# Patient Record
Sex: Male | Born: 1990 | Race: Black or African American | Hispanic: No | Marital: Single | Smoking: Current every day smoker
Health system: Southern US, Community
[De-identification: ages and names within clinical notes are randomized; demographics above are authoritative.]

## PROBLEM LIST (undated history)

## (undated) ENCOUNTER — Emergency Department (HOSPITAL_BASED_OUTPATIENT_CLINIC_OR_DEPARTMENT_OTHER): Admission: EM | Source: Home / Self Care

## (undated) DIAGNOSIS — I10 Essential (primary) hypertension: Secondary | ICD-10-CM

## (undated) DIAGNOSIS — E119 Type 2 diabetes mellitus without complications: Secondary | ICD-10-CM

## (undated) HISTORY — DX: Type 2 diabetes mellitus without complications: E11.9

## (undated) HISTORY — DX: Essential (primary) hypertension: I10

---
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
## 2010-08-08 ENCOUNTER — Emergency Department (INDEPENDENT_AMBULATORY_CARE_PROVIDER_SITE_OTHER)

## 2010-08-08 ENCOUNTER — Emergency Department (HOSPITAL_BASED_OUTPATIENT_CLINIC_OR_DEPARTMENT_OTHER)
Admission: EM | Admit: 2010-08-08 | Discharge: 2010-08-09 | Disposition: A | Discharge status: Home / Self Care | Attending: Emergency Medicine | Admitting: Emergency Medicine

## 2010-08-08 DIAGNOSIS — F17200 Nicotine dependence, unspecified, uncomplicated: Secondary | ICD-10-CM | POA: Insufficient documentation

## 2010-08-08 DIAGNOSIS — M545 Low back pain: Secondary | ICD-10-CM

## 2013-11-29 ENCOUNTER — Emergency Department (HOSPITAL_BASED_OUTPATIENT_CLINIC_OR_DEPARTMENT_OTHER)

## 2013-11-29 ENCOUNTER — Encounter (HOSPITAL_BASED_OUTPATIENT_CLINIC_OR_DEPARTMENT_OTHER): Payer: Self-pay | Admitting: Emergency Medicine

## 2013-11-29 ENCOUNTER — Emergency Department (HOSPITAL_BASED_OUTPATIENT_CLINIC_OR_DEPARTMENT_OTHER)
Admission: EM | Admit: 2013-11-29 | Discharge: 2013-11-29 | Disposition: A | Discharge status: Home / Self Care | Attending: Emergency Medicine | Admitting: Emergency Medicine

## 2013-11-29 DIAGNOSIS — X500XXA Overexertion from strenuous movement or load, initial encounter: Secondary | ICD-10-CM | POA: Insufficient documentation

## 2013-11-29 DIAGNOSIS — S93409A Sprain of unspecified ligament of unspecified ankle, initial encounter: Secondary | ICD-10-CM | POA: Insufficient documentation

## 2013-11-29 DIAGNOSIS — Y9367 Activity, basketball: Secondary | ICD-10-CM | POA: Insufficient documentation

## 2013-11-29 DIAGNOSIS — Y92838 Other recreation area as the place of occurrence of the external cause: Secondary | ICD-10-CM

## 2013-11-29 DIAGNOSIS — F17200 Nicotine dependence, unspecified, uncomplicated: Secondary | ICD-10-CM | POA: Insufficient documentation

## 2013-11-29 DIAGNOSIS — S93401A Sprain of unspecified ligament of right ankle, initial encounter: Secondary | ICD-10-CM

## 2013-11-29 DIAGNOSIS — Z8781 Personal history of (healed) traumatic fracture: Secondary | ICD-10-CM | POA: Insufficient documentation

## 2013-11-29 DIAGNOSIS — Y9239 Other specified sports and athletic area as the place of occurrence of the external cause: Secondary | ICD-10-CM | POA: Insufficient documentation

## 2013-11-29 MED ORDER — IBUPROFEN 800 MG PO TABS: 800.0000 mg | ORAL_TABLET | Freq: Three times a day (TID) | ORAL | Status: DC | PRN

## 2013-11-29 NOTE — Discharge Instructions (Signed)
Take ibuprofen as directed as needed for pain and swelling.  Ankle Sprain An ankle sprain is an injury to the strong, fibrous tissues (ligaments) that hold the bones of your ankle joint together.  CAUSES An ankle sprain is usually caused by a fall or by twisting your ankle. Ankle sprains most commonly occur when you step on the outer edge of your foot, and your ankle turns inward. People who participate in sports are more prone to these types of injuries.  SYMPTOMS   Pain in your ankle. The pain may be present at rest or only when you are trying to stand or walk.  Swelling.  Bruising. Bruising may develop immediately or within 1 to 2 days after your injury.  Difficulty standing or walking, particularly when turning corners or changing directions. DIAGNOSIS  Your caregiver will ask you details about your injury and perform a physical exam of your ankle to determine if you have an ankle sprain. During the physical exam, your caregiver will press on and apply pressure to specific areas of your foot and ankle. Your caregiver will try to move your ankle in certain ways. An X-ray exam may be done to be sure a bone was not broken or a ligament did not separate from one of the bones in your ankle (avulsion fracture).  TREATMENT  Certain types of braces can help stabilize your ankle. Your caregiver can make a recommendation for this. Your caregiver may recommend the use of medicine for pain. If your sprain is severe, your caregiver may refer you to a surgeon who helps to restore function to parts of your skeletal system (orthopedist) or a physical therapist. HOME CARE INSTRUCTIONS   Apply ice to your injury for 1 2 days or as directed by your caregiver. Applying ice helps to reduce inflammation and pain.  Put ice in a plastic bag.  Place a towel between your skin and the bag.  Leave the ice on for 15-20 minutes at a time, every 2 hours while you are awake.  Only take over-the-counter or  prescription medicines for pain, discomfort, or fever as directed by your caregiver.  Elevate your injured ankle above the level of your heart as much as possible for 2 3 days.  If your caregiver recommends crutches, use them as instructed. Gradually put weight on the affected ankle. Continue to use crutches or a cane until you can walk without feeling pain in your ankle.  If you have a plaster splint, wear the splint as directed by your caregiver. Do not rest it on anything harder than a pillow for the first 24 hours. Do not put weight on it. Do not get it wet. You may take it off to take a shower or bath.  You may have been given an elastic bandage to wear around your ankle to provide support. If the elastic bandage is too tight (you have numbness or tingling in your foot or your foot becomes cold and blue), adjust the bandage to make it comfortable.  If you have an air splint, you may blow more air into it or let air out to make it more comfortable. You may take your splint off at night and before taking a shower or bath. Wiggle your toes in the splint several times per day to decrease swelling. SEEK MEDICAL CARE IF:   You have rapidly increasing bruising or swelling.  Your toes feel extremely cold or you lose feeling in your foot.  Your pain is not relieved with  medicine. SEEK IMMEDIATE MEDICAL CARE IF:  Your toes are numb or blue.  You have severe pain that is increasing. MAKE SURE YOU:   Understand these instructions.  Will watch your condition.  Will get help right away if you are not doing well or get worse. Document Released: 06/24/2005 Document Revised: 03/18/2012 Document Reviewed: 07/06/2011 [facility] Patient Information 2014 [place], [place].

## 2013-11-29 NOTE — ED Notes (Signed)
Pt c/o rt ankle pain after playing basketball, hx of fx to rt ankle

## 2013-11-29 NOTE — ED Provider Notes (Signed)
CSN: [id number]     Arrival date & time 11/29/13  2148 History   First MD Initiated Contact with Patient 11/29/13 2153     Chief Complaint  Patient presents with  . Ankle Pain     (Consider location/radiation/quality/duration/timing/severity/associated sxs/prior Treatment) HPI Comments: 23 year old male presents to the emergency department complaining of right ankle pain after landing wrong while playing basketball earlier this evening. Patient states he was jumping up when he landed on his right ankle causing it to twist. States it swelled immediately. He placed an ankle brace that he had from a prior ankle fracture on it with some relief. He has not taken any medication for his pain. Pain currently rated 8/10, described as sharp and throbbing. States his ankle is tingling. Denies numbness or tingling in his toes.  Patient is a 23 y.o. male presenting with ankle pain. The history is provided by the patient.  Ankle Pain   History reviewed. No pertinent past medical history. History reviewed. No pertinent past surgical history. No family history on file. History  Substance Use Topics  . Smoking status: Current Every Day Smoker  . Smokeless tobacco: Not on file  . Alcohol Use: Yes     Comment: social    Review of Systems  Constitutional: Negative.   HENT: Negative.   Musculoskeletal:       Positive for right ankle pain and swelling.  Skin: Negative for color change.  Neurological: Negative for numbness.      Allergies  Review of patient's allergies indicates no known allergies.  Home Medications   Prior to Admission medications   Not on File   BP 138/81  Pulse 67  Temp(Src) 99.1 F (37.3 C) (Oral)  Resp 16  Ht 5\' 5"  (1.651 m)  Wt 250 lb (113.399 kg)  BMI 41.60 kg/m2  SpO2 98% Physical Exam  Nursing note and vitals reviewed. Constitutional: He is oriented to person, place, and time. He appears well-developed and well-nourished. No distress.  HENT:  Head:  Normocephalic and atraumatic.  Mouth/Throat: Oropharynx is clear and moist.  Eyes: Conjunctivae are normal.  Neck: Normal range of motion. Neck supple.  Cardiovascular: Normal rate, regular rhythm and normal heart sounds.   +2 PT/DP pulses on right.  Pulmonary/Chest: Effort normal and breath sounds normal.  Musculoskeletal:  Tender to palpation both medial and lateral aspect of right ankle, swelling noted laterally. Range of motion limited by pain, more so with ankle flexion. No deformity. No tenderness of proximal fibula. Wiggles his toes without any difficulty.  Neurological: He is alert and oriented to person, place, and time.  Sensation intact.  Skin: Skin is warm and dry. He is not diaphoretic.  Psychiatric: He has a normal mood and affect. His behavior is normal.    ED Course  Procedures (including critical care time) Labs Review Labs Reviewed - No data to display  Imaging Review Dg Ankle Complete Right  11/29/2013   CLINICAL DATA:  Rolled RIGHT ankle today, lateral pain and swelling  EXAM: RIGHT ANKLE - COMPLETE 3+ VIEW  COMPARISON:  10/30/2004  FINDINGS: Soft tissue swelling greatest laterally.  Ankle mortise intact.  Osseous mineralization normal.  Tiny ossicle adjacent to tip of medial malleolus, appears corticated/old.  No acute fracture, dislocation, or bone destruction.  IMPRESSION: No acute osseous abnormalities.   Electronically Signed   By: Ulyses Southward M.D.   On: 11/29/2013 22:39     EKG Interpretation None      MDM   Final diagnoses:  Right ankle sprain    Neurovascularly intact. Xray without any acute finding. Ace wrap applied. Pt would not like crutches. Discussed RICE, NSAIDs. Stable for d/c. Return precautions given. Patient states understanding of treatment care plan and is agreeable.   Trevor MaceRobyn M Albert, PA-C 11/29/13 2254

## 2013-12-03 NOTE — ED Provider Notes (Signed)
Medical screening examination/treatment/procedure(s) were performed by non-physician practitioner and as supervising physician I was immediately available for consultation/collaboration.   EKG Interpretation None        Vara Mairena T Jovani Colquhoun, MD 12/03/13 1559 

## 2017-03-22 ENCOUNTER — Emergency Department (HOSPITAL_COMMUNITY)

## 2017-03-22 ENCOUNTER — Emergency Department (HOSPITAL_COMMUNITY)
Admission: EM | Admit: 2017-03-22 | Discharge: 2017-03-22 | Disposition: A | Discharge status: Home / Self Care | Attending: Emergency Medicine | Admitting: Emergency Medicine

## 2017-03-22 ENCOUNTER — Encounter (HOSPITAL_COMMUNITY): Payer: Self-pay | Admitting: Emergency Medicine

## 2017-03-22 DIAGNOSIS — M25552 Pain in left hip: Secondary | ICD-10-CM

## 2017-03-22 DIAGNOSIS — F17200 Nicotine dependence, unspecified, uncomplicated: Secondary | ICD-10-CM | POA: Insufficient documentation

## 2017-03-22 MED ORDER — KETOROLAC TROMETHAMINE 30 MG/ML IJ SOLN
15.0000 mg | Freq: Once | INTRAMUSCULAR | Status: AC
  Administered 2017-03-22: 15 mg via INTRAMUSCULAR
  Filled 2017-03-22: qty 1

## 2017-03-22 MED ORDER — IBUPROFEN 600 MG PO TABS: 600.0000 mg | ORAL_TABLET | Freq: Four times a day (QID) | ORAL | 0 refills | Status: DC | PRN

## 2017-03-22 MED ORDER — CYCLOBENZAPRINE HCL 10 MG PO TABS: 10.0000 mg | ORAL_TABLET | Freq: Two times a day (BID) | ORAL | 0 refills | Status: DC | PRN

## 2017-03-22 MED ORDER — ACETAMINOPHEN 500 MG PO TABS: 500.0000 mg | ORAL_TABLET | Freq: Four times a day (QID) | ORAL | 0 refills | Status: DC | PRN

## 2017-03-22 NOTE — Discharge Instructions (Signed)
Medications: Flexeril, ibuprofen, Tylenol  Treatment: Take Flexeril twice daily for muscle pain and spasms. Do not drive or operate machinery when taking this medication. It may be best only take this medication at home as it can make you sleepy. Take ibuprofen every 6 hours as needed for your pain. You can also alternate with Tylenol in between. Use a heating pad and ice alternating 20 minutes on, 20 minutes off. Attempt the exercises and stretches as outlined below.  Follow-up: Please follow-up with the orthopedic doctor for further evaluation and treatment. Please return to emergency department if you develop any new or worsening symptoms.

## 2017-03-22 NOTE — ED Notes (Signed)
Informed by Florentina Addison, RN that pt was able to stand and pivot from the wheelchair to chair in room.

## 2017-03-22 NOTE — ED Provider Notes (Signed)
[facility]-EMERGENCY DEPT Provider Note   CSN: [id number] Arrival date & time: 03/22/17  1139     History   Chief Complaint Chief Complaint  Patient presents with  . Hip Pain    HPI Dylan Morris is a 26 y.o. male who was previously healthy who presents with a 1 month history of left hip pain. Patient reports his pain initially began as intermittent, however it has been constant for the past few days. He reports that it starts out with a cramp becomes severe. It radiates from the back of his left hip/low back down to his ankle. Patient reports he occasionally feels pain in his left groin as well. It is worse with movement, better with rest. 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 He reports mild tingling at times, but nothing severe. He denies any saddle anesthesia, loss of bowel or bladder control. He denies any fevers, history of IVDU, abdominal pain, nausea, vomiting, or other urinary symptoms. He has taken an unknown muscle relaxer (a total of 4 pills) without relief. He has not tried anything else home.  HPI  History reviewed. No pertinent past medical history.  There are no active problems to display for this patient.   History reviewed. No pertinent surgical history.     Home Medications    Prior to Admission medications   Medication Sig Start Date End Date Taking? Authorizing Provider  acetaminophen (TYLENOL) 500 MG tablet Take 1 tablet (500 mg total) by mouth every 6 (six) hours as needed. 03/22/17   Zania Kalisz, Waylan Boga, PA-C  cyclobenzaprine (FLEXERIL) 10 MG tablet Take 1 tablet (10 mg total) by mouth 2 (two) times daily as needed for muscle spasms. 03/22/17   Jerine Surles, Waylan Boga, PA-C  ibuprofen (ADVIL,MOTRIN) 600 MG tablet Take 1 tablet (600 mg total) by mouth every 6 (six) hours as needed. 03/22/17   Emi Holes, PA-C    Family History No family history on file.  Social History Social History  Substance Use Topics  .  Smoking status: Current Every Day Smoker  . Smokeless tobacco: Not on file  . Alcohol use Yes     Comment: social     Allergies   Patient has no known allergies.   Review of Systems Review of Systems  Constitutional: Negative for chills and fever.  HENT: Negative for facial swelling and sore throat.   Respiratory: Negative for shortness of breath.   Cardiovascular: Negative for chest pain.  Gastrointestinal: Negative for abdominal pain, nausea and vomiting.  Genitourinary: Negative for dysuria.  Musculoskeletal: Positive for arthralgias and myalgias. Negative for back pain.  Skin: Negative for rash and wound.  Neurological: Negative for headaches.  Psychiatric/Behavioral: The patient is not nervous/anxious.      Physical Exam Updated Vital Signs BP (!) 112/101 (BP Location: Left Arm)   Pulse 75   Temp 99 F (37.2 C) (Oral)   Resp 16   Ht  (1.676 m)   Wt 113.4 kg (250 lb)   SpO2 100%   BMI 40.35 kg/m   Physical Exam  Constitutional: He appears well-developed and well-nourished. No distress.  HENT:  Head: Normocephalic and atraumatic.  Mouth/Throat: Oropharynx is clear and moist. No oropharyngeal exudate.  Eyes: Pupils are equal, round, and reactive to light. Conjunctivae are normal. Right eye exhibits no discharge. Left eye exhibits no discharge. No scleral icterus.  Neck: Normal range of motion. Neck supple. No thyromegaly present.  Cardiovascular: Normal rate, regular rhythm, normal heart sounds and intact distal pulses.  Exam reveals no gallop and no friction rub.   No murmur heard. Pulmonary/Chest: Effort normal and breath sounds normal. No stridor. No respiratory distress. He has no wheezes. He has no rales.  Abdominal: Soft. Bowel sounds are normal. He exhibits no distension. There is no tenderness. There is no rebound and no guarding.  Musculoskeletal: He exhibits no edema.       Legs: Lymphadenopathy:    He has no cervical adenopathy.  Neurological:  He is alert. Coordination normal.  Normal sensation, 5/5 strength to bilateral lower extremities  Skin: Skin is warm and dry. No rash noted. He is not diaphoretic. No pallor.  Psychiatric: He has a normal mood and affect.  Nursing note and vitals reviewed.    ED Treatments / Results  Labs (all labs ordered are listed, but only abnormal results are displayed) Labs Reviewed - No data to display  EKG  EKG Interpretation None       Radiology Dg Hip Unilat W Or Wo Pelvis 2-3 Views Left  Result Date: 03/22/2017 CLINICAL DATA:  Pain for 1 month without trauma. EXAM: DG HIP (WITH OR WITHOUT PELVIS) 2-3V LEFT COMPARISON:  None. FINDINGS: There is no evidence of hip fracture or dislocation. There is no evidence of arthropathy or other focal bone abnormality. IMPRESSION: Negative. Electronically Signed   By: Gerome Sam III M.D   On: 03/22/2017 13:06    Procedures Procedures (including critical care time)  Medications Ordered in ED Medications  ketorolac (TORADOL) 30 MG/ML injection 15 mg (15 mg Intramuscular Given 03/22/17 1214)     Initial Impression / Assessment and Plan / ED Course  I have reviewed the triage vital signs and the nursing notes.  Pertinent labs & imaging results that were available during my care of the patient were reviewed by me and considered in my medical decision making (see chart for details).     Suspect radicular pain versus sciatica. No bowel or bladder incontinence or saddle anesthesia. Hip x-ray is negative. Patient given Toradol injection here. We'll discharge home with Flexeril, ibuprofen, Tylenol. Supportive treatment discussed including stretching, rest, ice/heat. Patient given crutches for comfort. Follow-up to orthopedics for further evaluation and treatment. Return precautions discussed. Patient understands and agrees with plan. Patient vitals stable throughout ED course and discharged in satisfactory condition.  Final Clinical Impressions(s) /  ED Diagnoses   Final diagnoses:  Left hip pain    New Prescriptions Discharge Medication List as of 03/22/2017  1:36 PM    START taking these medications   Details  acetaminophen (TYLENOL) 500 MG tablet Take 1 tablet (500 mg total) by mouth every 6 (six) hours as needed., Starting Sat 03/22/2017, Print    cyclobenzaprine (FLEXERIL) 10 MG tablet Take 1 tablet (10 mg total) by mouth 2 (two) times daily as needed for muscle spasms., Starting Sat 03/22/2017, Print         [address] [place], PA-C 03/22/17 1453    Jacalyn Lefevre, MD 03/22/17 1546

## 2017-03-22 NOTE — ED Triage Notes (Addendum)
Patient c/o left sided lower back pain radiating into left leg onset of a month ago. States it begins as a cramp and becomes severe. States he cannot walk now, but pt ambulated in room with no difficulty or gait. No issues with bowel or bladder.

## 2019-03-06 IMAGING — CR DG HIP (WITH OR WITHOUT PELVIS) 2-3V*L*
3 series · 3 of 3 positions shown · non-contrast
Comparison: None.

CLINICAL DATA: Pain for 1 month without trauma.

EXAM:
DG HIP (WITH OR WITHOUT PELVIS) 2-3V LEFT

[t pelvis ap]
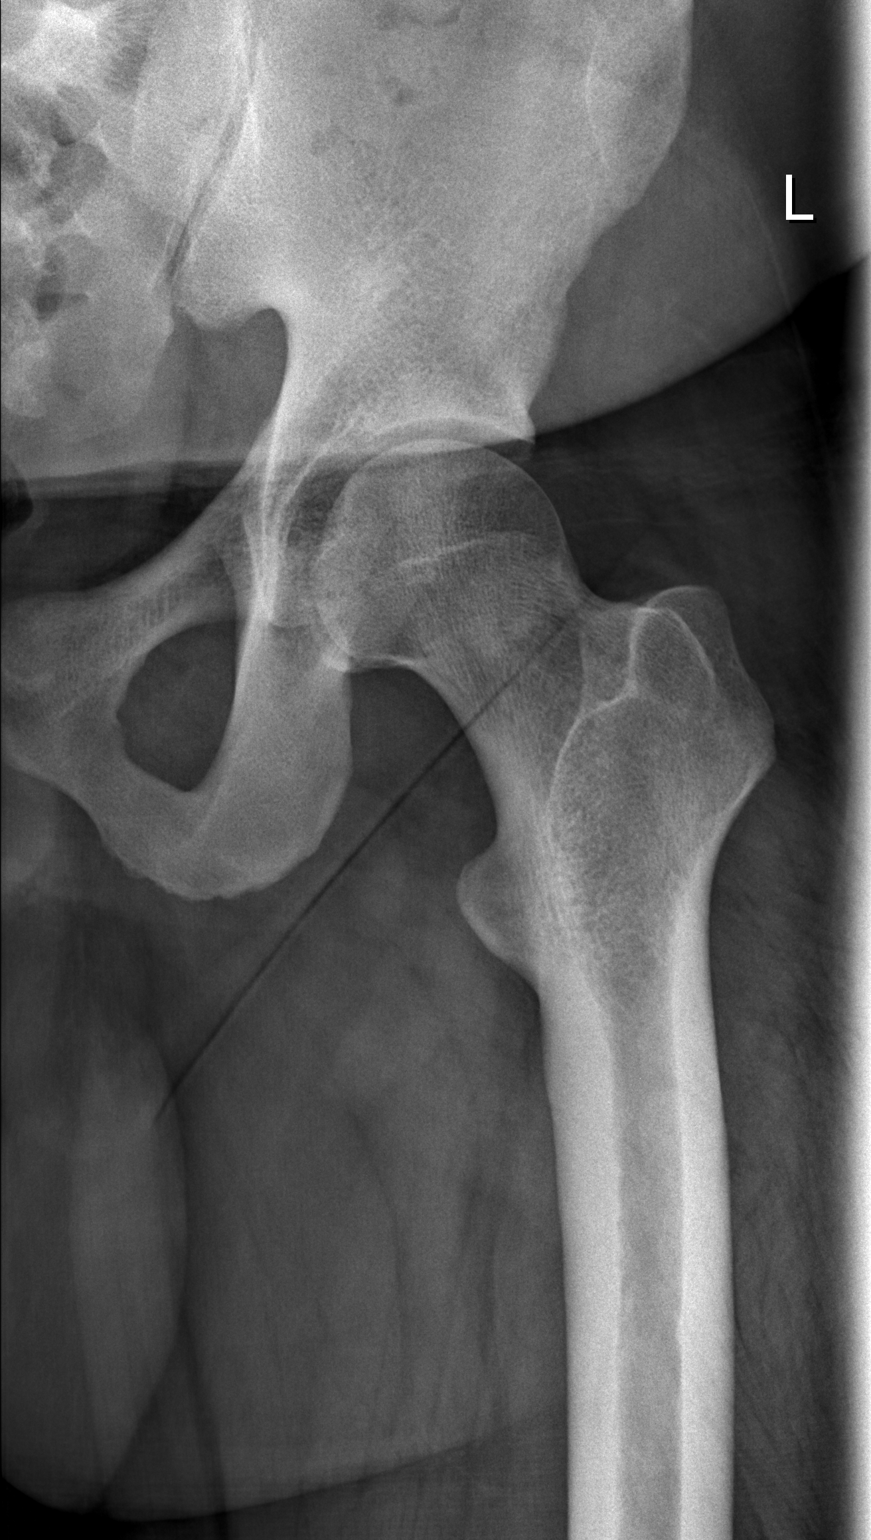

[t hip ap left]
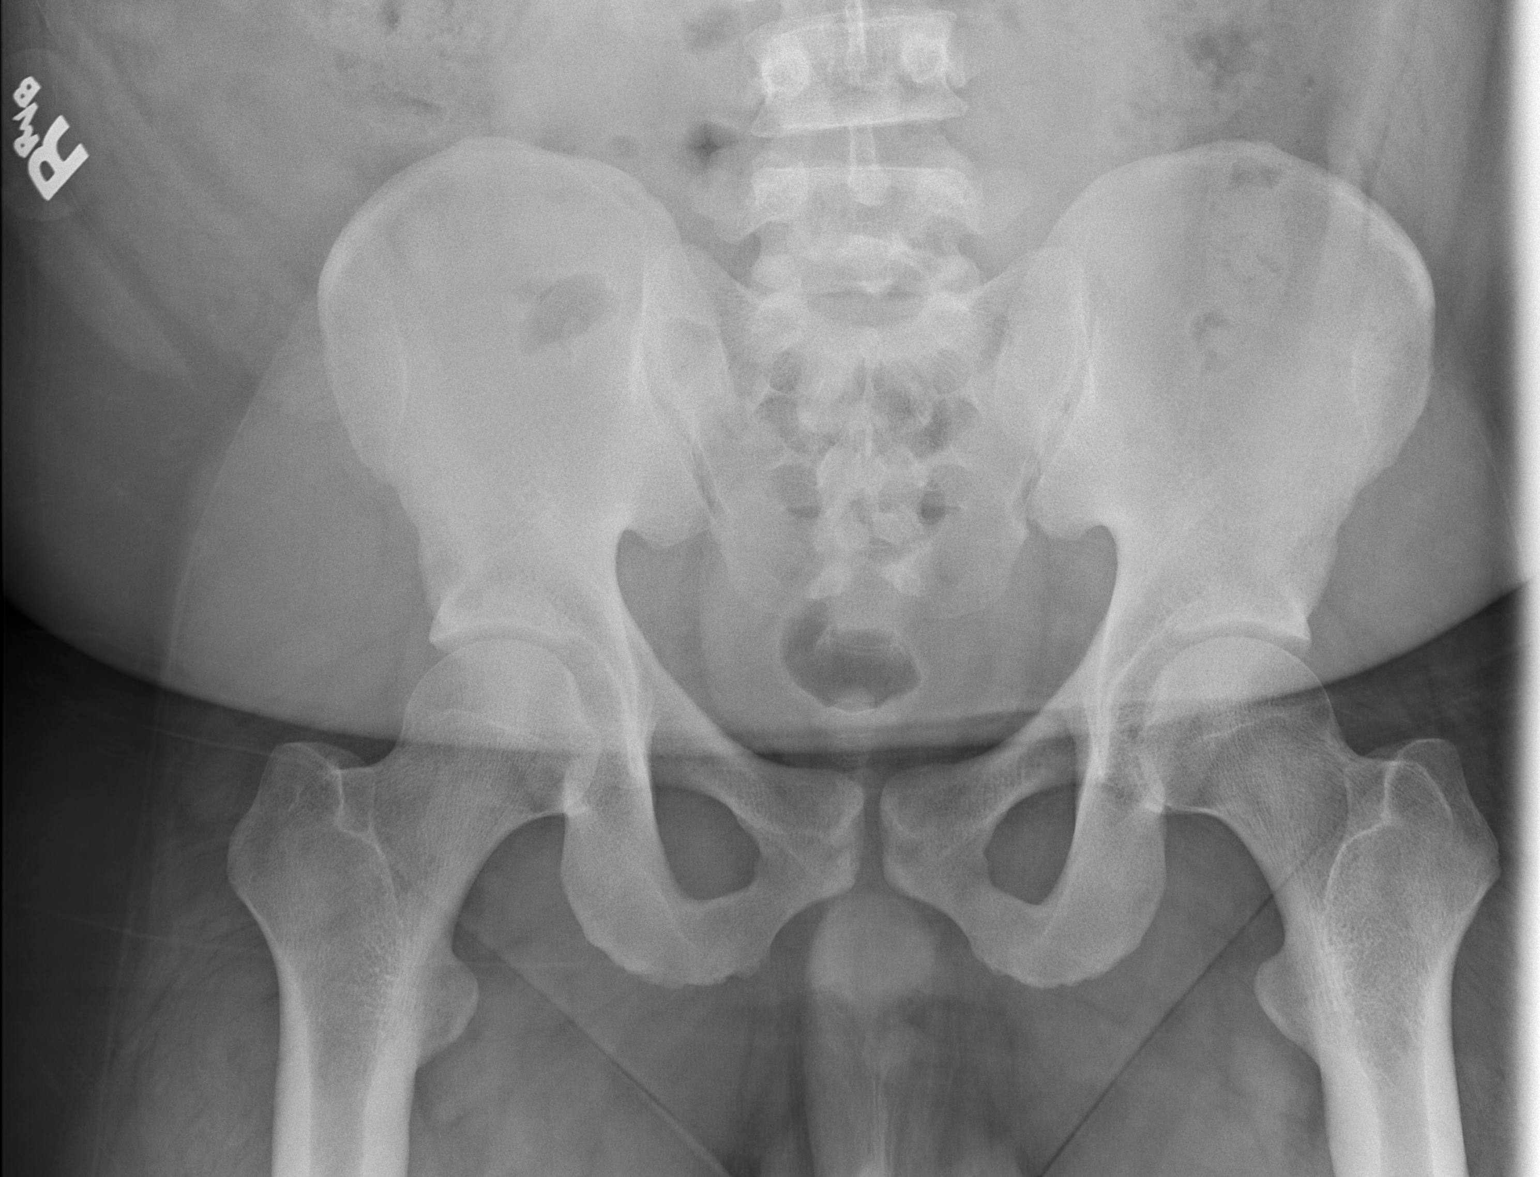

[t hip frog leg left]
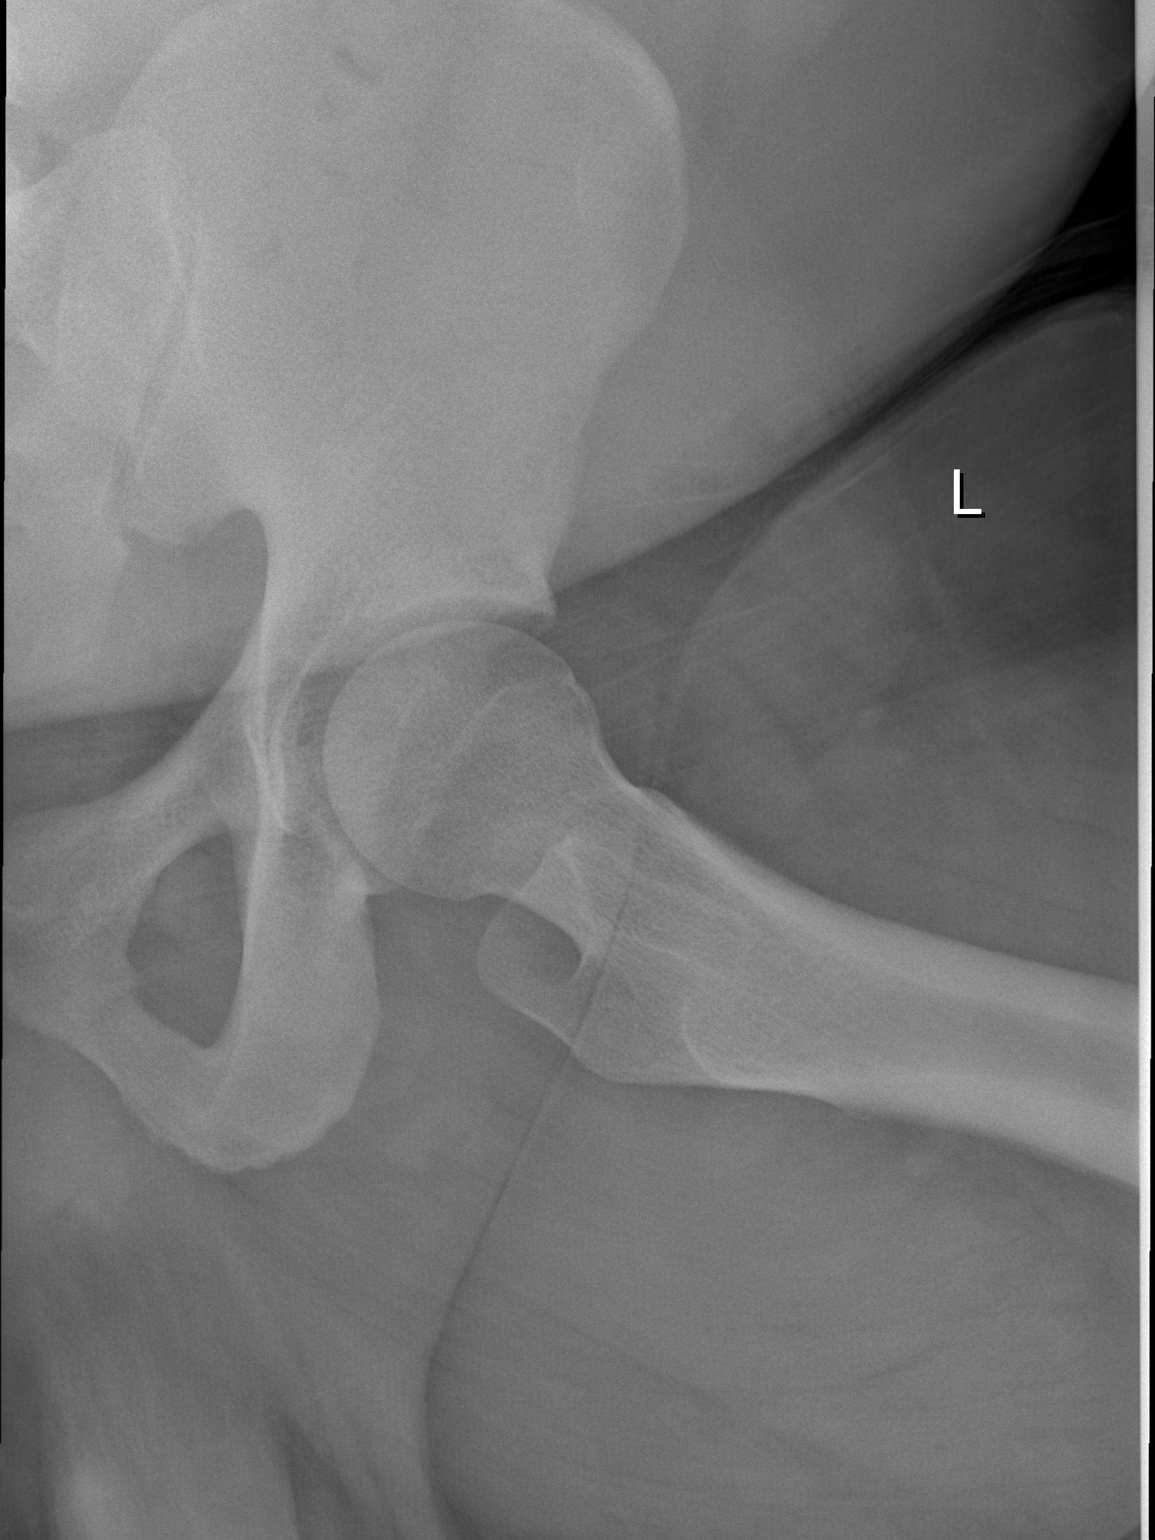

[3 of 3 positions shown; findings below may reference images not displayed]

FINDINGS: There is no evidence of hip fracture or dislocation. There is no
evidence of arthropathy or other focal bone abnormality.
IMPRESSION: Negative.

## 2023-01-07 ENCOUNTER — Other Ambulatory Visit: Payer: Self-pay

## 2023-01-07 ENCOUNTER — Emergency Department (HOSPITAL_BASED_OUTPATIENT_CLINIC_OR_DEPARTMENT_OTHER)
Admission: EM | Admit: 2023-01-07 | Discharge: 2023-01-07 | Disposition: A | Discharge status: Home / Self Care | Payer: BLUE CROSS/BLUE SHIELD | Attending: Emergency Medicine | Admitting: Emergency Medicine

## 2023-01-07 ENCOUNTER — Emergency Department (HOSPITAL_BASED_OUTPATIENT_CLINIC_OR_DEPARTMENT_OTHER): Payer: BLUE CROSS/BLUE SHIELD

## 2023-01-07 ENCOUNTER — Encounter (HOSPITAL_BASED_OUTPATIENT_CLINIC_OR_DEPARTMENT_OTHER): Payer: Self-pay | Admitting: Urology

## 2023-01-07 ENCOUNTER — Other Ambulatory Visit (HOSPITAL_BASED_OUTPATIENT_CLINIC_OR_DEPARTMENT_OTHER): Payer: Self-pay

## 2023-01-07 DIAGNOSIS — R1013 Epigastric pain: Secondary | ICD-10-CM | POA: Diagnosis not present

## 2023-01-07 DIAGNOSIS — R197 Diarrhea, unspecified: Secondary | ICD-10-CM | POA: Insufficient documentation

## 2023-01-07 DIAGNOSIS — R112 Nausea with vomiting, unspecified: Secondary | ICD-10-CM | POA: Insufficient documentation

## 2023-01-07 DIAGNOSIS — E871 Hypo-osmolality and hyponatremia: Secondary | ICD-10-CM | POA: Insufficient documentation

## 2023-01-07 DIAGNOSIS — E876 Hypokalemia: Secondary | ICD-10-CM | POA: Diagnosis not present

## 2023-01-07 DIAGNOSIS — D72829 Elevated white blood cell count, unspecified: Secondary | ICD-10-CM | POA: Diagnosis not present

## 2023-01-07 DIAGNOSIS — F17210 Nicotine dependence, cigarettes, uncomplicated: Secondary | ICD-10-CM | POA: Insufficient documentation

## 2023-01-07 LAB — MAGNESIUM: Magnesium: 2.1 mg/dL (ref 1.7–2.4)

## 2023-01-07 LAB — URINALYSIS, W/ REFLEX TO CULTURE (INFECTION SUSPECTED)
Bilirubin Urine: NEGATIVE
Glucose, UA: 100 mg/dL — AB
Hgb urine dipstick: NEGATIVE
Ketones, ur: NEGATIVE mg/dL
Leukocytes,Ua: NEGATIVE
Nitrite: NEGATIVE
Protein, ur: 30 mg/dL — AB
Specific Gravity, Urine: 1.02 (ref 1.005–1.030)
pH: 7.5 (ref 5.0–8.0)

## 2023-01-07 LAB — CBC WITH DIFFERENTIAL/PLATELET
Abs Immature Granulocytes: 0.04 10*3/uL (ref 0.00–0.07)
Basophils Absolute: 0 10*3/uL (ref 0.0–0.1)
Basophils Relative: 0 %
Eosinophils Absolute: 0 10*3/uL (ref 0.0–0.5)
Eosinophils Relative: 0 %
HCT: 42.8 % (ref 39.0–52.0)
Hemoglobin: 14.5 g/dL (ref 13.0–17.0)
Immature Granulocytes: 0 %
Lymphocytes Relative: 20 %
Lymphs Abs: 2.9 10*3/uL (ref 0.7–4.0)
MCH: 28.4 pg (ref 26.0–34.0)
MCHC: 33.9 g/dL (ref 30.0–36.0)
MCV: 83.8 fL (ref 80.0–100.0)
Monocytes Absolute: 1.1 10*3/uL — ABNORMAL HIGH (ref 0.1–1.0)
Monocytes Relative: 7 %
Neutro Abs: 10.6 10*3/uL — ABNORMAL HIGH (ref 1.7–7.7)
Neutrophils Relative %: 73 %
Platelets: 309 10*3/uL (ref 150–400)
RBC: 5.11 MIL/uL (ref 4.22–5.81)
RDW: 12.8 % (ref 11.5–15.5)
WBC: 14.7 10*3/uL — ABNORMAL HIGH (ref 4.0–10.5)
nRBC: 0 % (ref 0.0–0.2)

## 2023-01-07 LAB — COMPREHENSIVE METABOLIC PANEL
ALT: 31 U/L (ref 0–44)
AST: 28 U/L (ref 15–41)
Albumin: 3.8 g/dL (ref 3.5–5.0)
Alkaline Phosphatase: 72 U/L (ref 38–126)
Anion gap: 8 (ref 5–15)
BUN: 13 mg/dL (ref 6–20)
CO2: 30 mmol/L (ref 22–32)
Calcium: 9 mg/dL (ref 8.9–10.3)
Chloride: 96 mmol/L — ABNORMAL LOW (ref 98–111)
Creatinine, Ser: 0.83 mg/dL (ref 0.61–1.24)
GFR, Estimated: >60 mL/min (ref >60)
Glucose, Bld: 228 mg/dL — ABNORMAL HIGH (ref 70–99)
Potassium: 3 mmol/L — ABNORMAL LOW (ref 3.5–5.1)
Sodium: 134 mmol/L — ABNORMAL LOW (ref 135–145)
Total Bilirubin: 1.1 mg/dL (ref 0.3–1.2)
Total Protein: 7.8 g/dL (ref 6.5–8.1)

## 2023-01-07 LAB — LIPASE, BLOOD: Lipase: 34 U/L (ref 11–51)

## 2023-01-07 MED ORDER — POTASSIUM CHLORIDE CRYS ER 20 MEQ PO TBCR
40.0000 meq | EXTENDED_RELEASE_TABLET | Freq: Once | ORAL | Status: AC
  Administered 2023-01-07: 40 meq via ORAL
  Filled 2023-01-07: qty 2

## 2023-01-07 MED ORDER — SODIUM CHLORIDE 0.9 % IV BOLUS
1000.0000 mL | Freq: Once | INTRAVENOUS | Status: AC
  Administered 2023-01-07: 1000 mL via INTRAVENOUS

## 2023-01-07 MED ORDER — ONDANSETRON HCL 4 MG/2ML IJ SOLN
4.0000 mg | Freq: Once | INTRAMUSCULAR | Status: AC
  Administered 2023-01-07: 4 mg via INTRAVENOUS
  Filled 2023-01-07: qty 2

## 2023-01-07 MED ORDER — ONDANSETRON 4 MG PO TBDP
4.0000 mg | ORAL_TABLET | Freq: Three times a day (TID) | ORAL | 0 refills | Status: DC | PRN
  Filled 2023-01-07: qty 20, 7d supply, fill #0

## 2023-01-07 MED ORDER — IOHEXOL 300 MG/ML  SOLN
100.0000 mL | Freq: Once | INTRAMUSCULAR | Status: AC | PRN
  Administered 2023-01-07: 100 mL via INTRAVENOUS

## 2023-01-07 MED ORDER — KETOROLAC TROMETHAMINE 15 MG/ML IJ SOLN
15.0000 mg | Freq: Once | INTRAMUSCULAR | Status: AC
Start: 2023-01-07 — End: 2023-01-07
  Administered 2023-01-07: 15 mg via INTRAVENOUS
  Filled 2023-01-07: qty 1

## 2023-01-07 NOTE — ED Notes (Signed)
US at bedside

## 2023-01-07 NOTE — ED Notes (Signed)
ED Provider at bedside. 

## 2023-01-07 NOTE — ED Provider Notes (Signed)
[place] EMERGENCY DEPARTMENT AT [facility] Provider Note   CSN: [id number] Arrival date & time: 01/07/23  4098     History  Chief Complaint  Patient presents with   Emesis    Dylan Morris is a 32 y.o. male.  HPI   32 year old male presents emergency department with complaints of nausea, vomiting, diarrhea.  Patient states that symptoms been present since Friday of last week.  Reports 5-7 episodes of emesis per day and originally had diarrhea but is since ceased having bowel movements due to decreased p.o. intake.  States that he works at [employer] with multiple potential sick exposures.  Reports some pain in upper middle abdomen associated with bouts of emesis and minimally experienced otherwise; denies radiation of pain.  Denies any fever, chills, chest pain, shortness of breath, urinary symptoms, hematochezia/melena, hematemesis.   No significant pertinent past medical history.  Home Medications Prior to Admission medications   Medication Sig Start Date End Date Taking? Authorizing Provider  ondansetron (ZOFRAN-ODT) 4 MG disintegrating tablet Take 1 tablet (4 mg total) by mouth every 8 (eight) hours as needed for nausea or vomiting. 01/07/23  Yes Sherian Maroon A, PA  acetaminophen (TYLENOL) 500 MG tablet Take 1 tablet (500 mg total) by mouth every 6 (six) hours as needed. 03/22/17   Law, Waylan Boga, PA-C  cyclobenzaprine (FLEXERIL) 10 MG tablet Take 1 tablet (10 mg total) by mouth 2 (two) times daily as needed for muscle spasms. 03/22/17   Law, Waylan Boga, PA-C  ibuprofen (ADVIL,MOTRIN) 600 MG tablet Take 1 tablet (600 mg total) by mouth every 6 (six) hours as needed. 03/22/17   Emi Holes, PA-C      Allergies    Patient has no known allergies.    Review of Systems   Review of Systems  All other systems reviewed and are negative.   Physical Exam Updated Vital Signs BP 124/64 (BP Location: Right Arm)   Pulse 65   Temp 98.2 F (36.8 C) (Oral)    Resp 18   Ht 5\' 6"  (1.676 m)   Wt 113.4 kg   SpO2 95%   BMI 40.35 kg/m  Physical Exam Vitals and nursing note reviewed.  Constitutional:      General: He is not in acute distress.    Appearance: He is well-developed.  HENT:     Head: Normocephalic and atraumatic.  Eyes:     Conjunctiva/sclera: Conjunctivae normal.  Cardiovascular:     Rate and Rhythm: Normal rate and regular rhythm.     Heart sounds: No murmur heard. Pulmonary:     Effort: Pulmonary effort is normal. No respiratory distress.     Breath sounds: Normal breath sounds.  Abdominal:     General: There is no distension.     Palpations: Abdomen is soft.     Tenderness: There is abdominal tenderness. There is no right CVA tenderness, left CVA tenderness, guarding or rebound.     Comments: Mild epigastric tenderness to palpation.  Musculoskeletal:        General: No swelling.     Cervical back: Neck supple.  Skin:    General: Skin is warm and dry.     Capillary Refill: Capillary refill takes less than 2 seconds.  Neurological:     Mental Status: He is alert.  Psychiatric:        Mood and Affect: Mood normal.     ED Results / Procedures / Treatments   Labs (all labs ordered are  listed, but only abnormal results are displayed) Labs Reviewed  COMPREHENSIVE METABOLIC PANEL - Abnormal; Notable for the following components:      Result Value   Sodium 134 (*)    Potassium 3.0 (*)    Chloride 96 (*)    Glucose, Bld 228 (*)    All other components within normal limits  CBC WITH DIFFERENTIAL/PLATELET - Abnormal; Notable for the following components:   WBC 14.7 (*)    Neutro Abs 10.6 (*)    Monocytes Absolute 1.1 (*)    All other components within normal limits  URINALYSIS, W/ REFLEX TO CULTURE (INFECTION SUSPECTED) - Abnormal; Notable for the following components:   APPearance CLOUDY (*)    Glucose, UA 100 (*)    Protein, ur 30 (*)    Bacteria, UA FEW (*)    All other components within normal limits  LIPASE,  BLOOD  MAGNESIUM    EKG None  Radiology US Abdomen Limited RUQ (LIVER/GB)  Result Date: 01/07/2023 CLINICAL DATA:  Right upper quadrant pain for 5 days EXAM: ULTRASOUND ABDOMEN LIMITED RIGHT UPPER QUADRANT COMPARISON:  CT abdomen pelvis 01/07/2023 FINDINGS: Gallbladder: Gallstones: None Sludge: None Gallbladder Wall: Within normal limits Pericholecystic fluid: None Sonographic Murphy's Sign: Negative per technologist Common bile duct: Diameter: 3 mm Liver: Parenchymal echogenicity: Diffusely increased Contours: Normal Lesions: None Portal vein: Patent.  Hepatopetal flow Other: None. IMPRESSION: 1. No significant sonographic abnormality of the gallbladder. 2. Diffuse increased echogenicity of the hepatic parenchyma is a nonspecific indicator of hepatocellular dysfunction, most commonly steatosis. Electronically Signed   By: Acquanetta Belling M.D.   On: 01/07/2023 13:53   CT ABDOMEN PELVIS W CONTRAST  Result Date: 01/07/2023 CLINICAL DATA:  Five day history of upper abdominal pain associated with nausea, vomiting, and diarrhea EXAM: CT ABDOMEN AND PELVIS WITH CONTRAST TECHNIQUE: Multidetector CT imaging of the abdomen and pelvis was performed using the standard protocol following bolus administration of intravenous contrast. RADIATION DOSE REDUCTION: This exam was performed according to the departmental dose-optimization program which includes automated exposure control, adjustment of the mA and/or kV according to patient size and/or use of iterative reconstruction technique. CONTRAST:  OMNIPAQUE IOHEXOL 300 MG/ML  SOLN COMPARISON:  None Available. FINDINGS: Lower chest: No focal consolidation or pulmonary nodule in the lung bases. No pleural effusion or pneumothorax demonstrated. Partially imaged heart size is normal. Hepatobiliary: Subcentimeter segment 7 hypodensity (2:17), too small to characterize. No intra or extrahepatic biliary ductal dilation. Nondistended gallbladder. Questionable mild mural  thickening. Pancreas: No focal lesions or main ductal dilation. Spleen: Normal in size without focal abnormality. Adrenals/Urinary Tract: No adrenal nodules. No suspicious renal mass, calculi or hydronephrosis. No focal bladder wall thickening. Stomach/Bowel: Normal appearance of the stomach. No evidence of bowel wall thickening, distention, or inflammatory changes. Normal appendix. Vascular/Lymphatic: No significant vascular findings are present. No enlarged abdominal or pelvic lymph nodes. Reproductive: Prostate is unremarkable. Other: No free fluid, fluid collection, or free air. Musculoskeletal: No acute or abnormal lytic or blastic osseous lesions. IMPRESSION: 1. Nondistended gallbladder with questionable mild mural thickening, although evaluation is technically challenging in the setting of motion artifact. If there is clinical concern for acute cholecystitis, consider further evaluation with right upper quadrant ultrasound examination. 2. Otherwise no acute abdominopelvic findings. Electronically Signed   By: Agustin Cree M.D.   On: 01/07/2023 11:35    Procedures Procedures    Medications Ordered in ED Medications  ondansetron (ZOFRAN) injection 4 mg (4 mg Intravenous Given 01/07/23 0953)  sodium  chloride 0.9 % bolus 1,000 mL (0 mLs Intravenous Stopped 01/07/23 1116)  potassium chloride SA (KLOR-CON M) CR tablet 40 mEq (40 mEq Oral Given 01/07/23 1037)  iohexol (OMNIPAQUE) 300 MG/ML solution 100 mL (100 mLs Intravenous Contrast Given 01/07/23 1050)  ketorolac (TORADOL) 15 MG/ML injection 15 mg (15 mg Intravenous Given 01/07/23 1402)    ED Course/ Medical Decision Making/ A&P Clinical Course as of 01/07/23 1437  Tue Jan 07, 2023  1212 Reevaluation of the patient did show some evidence of right upper quadrant tenderness but pain is mostly epigastric region.  Will obtain right upper quadrant ultrasound given CT abdomen finding concerning for mild gallbladder wall thickening. [CR]    Clinical Course User  Index [CR] Peter Garter, PA                             Medical Decision Making Amount and/or Complexity of Data Reviewed Labs: ordered. Radiology: ordered.  Risk Prescription drug management.   This patient presents to the ED for concern of nausea, vomiting, diarrhea, this involves an extensive number of treatment options, and is a complaint that carries with it a high risk of complications and morbidity.  The differential diagnosis includes viral gastroenteritis, gastritis, PUD, pancreatitis, CBD pathology, cholecystitis, SBO/LBO, volvulus, diverticulitis, appendicitis, pyelonephritis, cystitis, colitis   Co morbidities that complicate the patient evaluation  See HPI   Additional history obtained:  Additional history obtained from EMR External records from outside source obtained and reviewed including hospital records   Lab Tests:  I Ordered, and personally interpreted labs.  The pertinent results include: Leukocytosis of 14.7.  No evidence of anemia.  Platelets within range.  Electrolyte abnormalities of hyponatremia, hypokalemia, hyperchloremia of 134, 3.0 and 96 respectively.  No transaminitis.  No renal dysfunction.  Lipase within normal limits.  Magnesium within normal limits.  UA significant for few bacteria, 30 proteins, 100 glucose but otherwise unremarkable.   Imaging Studies ordered:  I independently ordered imaging studies and independently evaluated and was agreement with radiologist interpretation. CT abdomen pelvis: Nondistended gallbladder and questionable mild mural thickening.  No acute abdominopelvic finding otherwise. Right upper quadrant ultrasound: No significant sonographic abnormality of the gallbladder.  Diffuse increased echogenicity of hepatic parenchyma.  No significant sonographic abnormality of the gallbladder.  2. Diffuse increased echogenicity of the hepatic parenchyma is a  nonspecific indicator of hepatocellular dysfunction, most  commonly  steatosis.   Cardiac Monitoring: / EKG:  The patient was maintained on a cardiac monitor.  I personally viewed and interpreted the cardiac monitored which showed an underlying rhythm of: Sinus rhythm   Consultations Obtained:  N/a   Problem List / ED Course / Critical interventions / Medication management  Nausea, vomiting, diarrhea I ordered medication including 1 L normal saline, Zofran   Reevaluation of the patient after these medicines showed that the patient improved I have reviewed the patients home medicines and have made adjustments as needed   Social Determinants of Health:  Chronic cigarette use daily.  Regular marijuana use   Test / Admission - Considered:  Nausea, vomiting, diarrhea Vitals signs significant for initial hypertension with blood pressure 173/86 with repeat 124/64. Otherwise within normal range and stable throughout visit. Laboratory studies significant for: See above 32 year old male presents emergency department with complaints of nausea, vomiting, diarrhea.  Symptoms have been present for the past 4 to 5 days.  Initially was going to withhold on imaging given pain mainly isolated  in epigastric region related to leukocytosis, persistence of pain despite medicines administered, CT imaging was pursued which showed possible inflammatory changes to the gallbladder wall.  Reevaluation of the patient did show some mild right upper quadrant tenderness but majority of pain elicited and epigastric region of which subsequently right upper quadrant ultrasound was performed which was negative for cholecystitis, CBD pathology or or other acute abnormality in right upper quadrant.  Symptoms most likely secondary to viral illness.  Will recommend continued antiemetic in the outpatient setting, bland diet, electrolyte rich fluid supplementation and follow-up with primary care for reassessment of symptoms.  Patient overall well-appearing, afebrile in no acute  distress, tolerating p.o. without difficulty.  Treatment plan discussed at length with patient and he acknowledged understanding was agreeable to said plan. Worrisome signs and symptoms were discussed with the patient, and the patient acknowledged understanding to return to the ED if noticed. Patient was stable upon discharge.          Final Clinical Impression(s) / ED Diagnoses Final diagnoses:  Nausea vomiting and diarrhea    Rx / DC Orders ED Discharge Orders          Ordered    ondansetron (ZOFRAN-ODT) 4 MG disintegrating tablet  Every 8 hours PRN        01/07/23 1130              Peter Garter, [place] 01/07/23 1437    Sloan Leiter, DO 01/09/23 [phone]

## 2023-01-07 NOTE — Discharge Instructions (Addendum)
As discussed, workup today is reassuring.  Further imaging of your gallbladder did not showed signs of inflammation or stone.  Suspect your symptoms are likely secondary to viral illness.  Will treat this with nausea/vomiting medicine in the form of Zofran to take as needed.  Recommend bland diet over the next few days until you are able to tolerate more complex foods.  You may find over-the-counter Imodium beneficial to help with excessive/persistent diarrhea.  Recommend electrolyte rich fluid supplementation in the form of Pedialyte, sugar-free Gatorade, Body Armor.  Recommend follow-up with primary care for reassessment of your symptoms.  Please not hesitate to return to emergency department if the worrisome signs and symptoms we discussed become apparent.

## 2023-01-07 NOTE — ED Notes (Signed)
PO challenge tolerated.

## 2023-01-07 NOTE — ED Notes (Signed)
Patient transported to CT 

## 2023-01-07 NOTE — ED Triage Notes (Signed)
Pt states N/V/D since x 5 days States unable to tolerate any po intake at this time  States upper abdominal pain

## 2023-01-09 ENCOUNTER — Emergency Department (HOSPITAL_COMMUNITY)
Admission: EM | Admit: 2023-01-09 | Discharge: 2023-01-09 | Disposition: A | Discharge status: Home / Self Care | Payer: BLUE CROSS/BLUE SHIELD | Attending: Emergency Medicine | Admitting: Emergency Medicine

## 2023-01-09 ENCOUNTER — Encounter (HOSPITAL_COMMUNITY): Payer: Self-pay

## 2023-01-09 ENCOUNTER — Other Ambulatory Visit: Payer: Self-pay

## 2023-01-09 DIAGNOSIS — R1013 Epigastric pain: Secondary | ICD-10-CM | POA: Insufficient documentation

## 2023-01-09 DIAGNOSIS — R112 Nausea with vomiting, unspecified: Secondary | ICD-10-CM | POA: Diagnosis present

## 2023-01-09 DIAGNOSIS — R739 Hyperglycemia, unspecified: Secondary | ICD-10-CM | POA: Insufficient documentation

## 2023-01-09 DIAGNOSIS — R197 Diarrhea, unspecified: Secondary | ICD-10-CM | POA: Insufficient documentation

## 2023-01-09 DIAGNOSIS — E860 Dehydration: Secondary | ICD-10-CM | POA: Insufficient documentation

## 2023-01-09 LAB — CBC WITH DIFFERENTIAL/PLATELET
Abs Immature Granulocytes: 0.11 10*3/uL — ABNORMAL HIGH (ref 0.00–0.07)
Basophils Absolute: 0.1 10*3/uL (ref 0.0–0.1)
Basophils Relative: 0 %
Eosinophils Absolute: 0.1 10*3/uL (ref 0.0–0.5)
Eosinophils Relative: 0 %
HCT: 46.3 % (ref 39.0–52.0)
Hemoglobin: 15.6 g/dL (ref 13.0–17.0)
Immature Granulocytes: 1 %
Lymphocytes Relative: 18 %
Lymphs Abs: 2.1 10*3/uL (ref 0.7–4.0)
MCH: 28.3 pg (ref 26.0–34.0)
MCHC: 33.7 g/dL (ref 30.0–36.0)
MCV: 84 fL (ref 80.0–100.0)
Monocytes Absolute: 0.7 10*3/uL (ref 0.1–1.0)
Monocytes Relative: 6 %
Neutro Abs: 9.1 10*3/uL — ABNORMAL HIGH (ref 1.7–7.7)
Neutrophils Relative %: 75 %
Platelets: 298 10*3/uL (ref 150–400)
RBC: 5.51 MIL/uL (ref 4.22–5.81)
RDW: 12.6 % (ref 11.5–15.5)
WBC: 12.1 10*3/uL — ABNORMAL HIGH (ref 4.0–10.5)
nRBC: 0 % (ref 0.0–0.2)

## 2023-01-09 LAB — URINALYSIS, ROUTINE W REFLEX MICROSCOPIC
Bilirubin Urine: NEGATIVE
Glucose, UA: 150 mg/dL — AB
Hgb urine dipstick: NEGATIVE
Ketones, ur: 5 mg/dL — AB
Leukocytes,Ua: NEGATIVE
Nitrite: NEGATIVE
Protein, ur: NEGATIVE mg/dL
Specific Gravity, Urine: 1.017 (ref 1.005–1.030)
pH: 6 (ref 5.0–8.0)

## 2023-01-09 LAB — COMPREHENSIVE METABOLIC PANEL
ALT: 54 U/L — ABNORMAL HIGH (ref 0–44)
AST: 30 U/L (ref 15–41)
Albumin: 3.8 g/dL (ref 3.5–5.0)
Alkaline Phosphatase: 72 U/L (ref 38–126)
Anion gap: 10 (ref 5–15)
BUN: 16 mg/dL (ref 6–20)
CO2: 26 mmol/L (ref 22–32)
Calcium: 8.9 mg/dL (ref 8.9–10.3)
Chloride: 99 mmol/L (ref 98–111)
Creatinine, Ser: 0.98 mg/dL (ref 0.61–1.24)
GFR, Estimated: >60 mL/min (ref >60)
Glucose, Bld: 253 mg/dL — ABNORMAL HIGH (ref 70–99)
Potassium: 3 mmol/L — ABNORMAL LOW (ref 3.5–5.1)
Sodium: 135 mmol/L (ref 135–145)
Total Bilirubin: 1.2 mg/dL (ref 0.3–1.2)
Total Protein: 7.5 g/dL (ref 6.5–8.1)

## 2023-01-09 LAB — C DIFFICILE QUICK SCREEN W PCR REFLEX
C Diff antigen: POSITIVE — AB
C Diff toxin: NEGATIVE

## 2023-01-09 LAB — LIPASE, BLOOD: Lipase: 57 U/L — ABNORMAL HIGH (ref 11–51)

## 2023-01-09 MED ORDER — ONDANSETRON HCL 4 MG/2ML IJ SOLN
4.0000 mg | Freq: Once | INTRAMUSCULAR | Status: AC
  Administered 2023-01-09: 4 mg via INTRAVENOUS
  Filled 2023-01-09: qty 2

## 2023-01-09 MED ORDER — LACTATED RINGERS IV BOLUS
1000.0000 mL | Freq: Once | INTRAVENOUS | Status: AC
  Administered 2023-01-09: 1000 mL via INTRAVENOUS

## 2023-01-09 MED ORDER — DIPHENHYDRAMINE HCL 50 MG/ML IJ SOLN
12.5000 mg | Freq: Once | INTRAMUSCULAR | Status: AC
  Administered 2023-01-09: 12.5 mg via INTRAVENOUS
  Filled 2023-01-09: qty 1

## 2023-01-09 MED ORDER — DROPERIDOL 2.5 MG/ML IJ SOLN
1.2500 mg | Freq: Once | INTRAMUSCULAR | Status: AC
  Administered 2023-01-09: 1.25 mg via INTRAVENOUS
  Filled 2023-01-09: qty 2

## 2023-01-09 MED ORDER — POTASSIUM CHLORIDE CRYS ER 20 MEQ PO TBCR
40.0000 meq | EXTENDED_RELEASE_TABLET | ORAL | Status: AC
  Administered 2023-01-09: 40 meq via ORAL
  Filled 2023-01-09: qty 2

## 2023-01-09 MED ORDER — METOCLOPRAMIDE HCL 10 MG PO TABS: 10.0000 mg | ORAL_TABLET | Freq: Four times a day (QID) | ORAL | 0 refills | Status: AC

## 2023-01-09 NOTE — Discharge Instructions (Signed)
You were seen for your nausea and vomiting and diarrhea in the emergency department.   At home, please take the Zofran and or the Reglan for your nausea and vomiting.    Check your MyChart online for the results of any tests that had not resulted by the time you left the emergency department.   Follow-up with your primary doctor in 1 week regarding your visit.  Please talk to them about your elevated blood pressure and blood sugar which may be reflective of diabetes.  Return immediately to the emergency department if you experience any of the following: Worsening pain, vomiting despite the medications, or any other concerning symptoms.    Thank you for visiting our Emergency Department. It was a pleasure taking care of you today.

## 2023-01-09 NOTE — ED Provider Notes (Signed)
[place] EMERGENCY DEPARTMENT AT [facility] Provider Note   CSN: [id number] Arrival date & time: 01/09/23  1042     History  Chief Complaint  Patient presents with   Emesis    Dylan Morris is a 32 y.o. male.  32 year old male with history of daily marijuana use who presents emergency department nausea, vomiting, and diarrhea.  6 days ago patient reports that he started experiencing frequent nausea vomiting and diarrhea.  Emesis has been nonbloody and nonbilious.  Says that he has had some dark stools but denies any melena or hematochezia.  Says that he was seen in the emergency department 2 days ago and diagnosed with a GI bug.  Did have a CT scan that showed some possible inflammation around the gallbladder but then had a follow-up ultrasound that was unremarkable.  Was feeling much better after going home yesterday and has been taking his Zofran.  Today woke up and took the Zofran and started having nausea and vomiting again.  Has had 5 episodes of this as well as 5 episodes of diarrhea.  Having some mild epigastric tenderness palpation.  Has not smoked marijuana since last Friday.  No known sick contacts, out of country travel, recent antibiotics, history of C. difficile, or camping.       Home Medications Prior to Admission medications   Medication Sig Start Date End Date Taking? Authorizing Provider  metoCLOPramide (REGLAN) 10 MG tablet Take 1 tablet (10 mg total) by mouth every 6 (six) hours. 01/09/23  Yes Rondel Baton, MD  acetaminophen (TYLENOL) 500 MG tablet Take 1 tablet (500 mg total) by mouth every 6 (six) hours as needed. 03/22/17   Law, Waylan Boga, PA-C  cyclobenzaprine (FLEXERIL) 10 MG tablet Take 1 tablet (10 mg total) by mouth 2 (two) times daily as needed for muscle spasms. 03/22/17   Law, Waylan Boga, PA-C  fidaxomicin (DIFICID) 200 MG TABS tablet Take 1 tablet (200 mg total) by mouth 2 (two) times daily. 01/10/23   Rondel Baton, MD  ibuprofen  (ADVIL,MOTRIN) 600 MG tablet Take 1 tablet (600 mg total) by mouth every 6 (six) hours as needed. 03/22/17   Law, Waylan Boga, PA-C  ondansetron (ZOFRAN-ODT) 4 MG disintegrating tablet Take 1 tablet (4 mg total) by mouth every 8 (eight) hours as needed for nausea or vomiting. 01/07/23   Peter Garter, PA      Allergies    Patient has no known allergies.    Review of Systems   Review of Systems  Physical Exam Updated Vital Signs BP (!) 181/102 (BP Location: Right Arm)   Pulse 84   Temp 98.3 F (36.8 C) (Oral)   Resp 19   Ht 5\' 6"  (1.676 m)   Wt 117.9 kg   SpO2 95%   BMI 41.97 kg/m  Physical Exam Vitals and nursing note reviewed.  Constitutional:      General: He is not in acute distress.    Appearance: He is well-developed.  HENT:     Head: Normocephalic and atraumatic.     Right Ear: External ear normal.     Left Ear: External ear normal.     Nose: Nose normal.  Eyes:     Extraocular Movements: Extraocular movements intact.     Conjunctiva/sclera: Conjunctivae normal.     Pupils: Pupils are equal, round, and reactive to light.  Cardiovascular:     Rate and Rhythm: Normal rate and regular rhythm.  Pulmonary:     Effort:  Pulmonary effort is normal. No respiratory distress.  Abdominal:     General: There is no distension.     Palpations: Abdomen is soft. There is no mass.     Tenderness: There is no abdominal tenderness. There is no guarding.  Musculoskeletal:     Cervical back: Normal range of motion and neck supple.  Skin:    General: Skin is warm and dry.  Neurological:     Mental Status: He is alert. Mental status is at baseline.  Psychiatric:        Mood and Affect: Mood normal.        Behavior: Behavior normal.     ED Results / Procedures / Treatments   Labs (all labs ordered are listed, but only abnormal results are displayed) Labs Reviewed  C DIFFICILE QUICK SCREEN W PCR REFLEX   - Abnormal; Notable for the following components:      Result Value    C Diff antigen POSITIVE (*)    All other components within normal limits  CLOSTRIDIUM DIFFICILE BY PCR, REFLEXED - Abnormal; Notable for the following components:   Toxigenic C. Difficile by PCR POSITIVE (*)    All other components within normal limits  COMPREHENSIVE METABOLIC PANEL - Abnormal; Notable for the following components:   Potassium 3.0 (*)    Glucose, Bld 253 (*)    ALT 54 (*)    All other components within normal limits  LIPASE, BLOOD - Abnormal; Notable for the following components:   Lipase 57 (*)    All other components within normal limits  CBC WITH DIFFERENTIAL/PLATELET - Abnormal; Notable for the following components:   WBC 12.1 (*)    Neutro Abs 9.1 (*)    Abs Immature Granulocytes 0.11 (*)    All other components within normal limits  URINALYSIS, ROUTINE W REFLEX MICROSCOPIC - Abnormal; Notable for the following components:   Glucose, UA 150 (*)    Ketones, ur 5 (*)    All other components within normal limits  GASTROINTESTINAL PANEL BY PCR, STOOL (REPLACES STOOL CULTURE)    EKG EKG Interpretation Date/Time:  Thursday January 09 2023 11:02:01 EDT Ventricular Rate:  57 PR Interval:  148 QRS Duration:  96 QT Interval:  437 QTC Calculation: 426 R Axis:   55  Text Interpretation: Sinus rhythm Confirmed by Vonita Moss [phone]) on 01/09/2023 11:35:15 AM  Radiology No results found.  Procedures Procedures    Medications Ordered in ED Medications  ondansetron (ZOFRAN) injection 4 mg (4 mg Intravenous Given 01/09/23 1115)  lactated ringers bolus 1,000 mL (0 mLs Intravenous Stopped 01/09/23 1321)  potassium chloride SA (KLOR-CON M) CR tablet 40 mEq (40 mEq Oral Given 01/09/23 1217)  diphenhydrAMINE (BENADRYL) injection 12.5 mg (12.5 mg Intravenous Given 01/09/23 1224)  droperidol (INAPSINE) 2.5 MG/ML injection 1.25 mg (1.25 mg Intravenous Given 01/09/23 1225)    ED Course/ Medical Decision Making/ A&P                             Medical Decision Making Amount  and/or Complexity of Data Reviewed Labs: ordered.  Risk Prescription drug management.   Dylan Morris is a 32 y.o. male with comorbidities that complicate the patient evaluation including daily marijuana use who presents emergency department with nausea, vomiting, and diarrhea  Initial Ddx:  Gastroenteritis, cannabinoid hyperemesis, appendicitis, C. difficile, infectious diarrhea  MDM/Course:  Feel the patient likely has gastroenteritis based on his symptoms.  Does smoke marijuana  daily which predisposes him to cannabinoid hyperemesis but this typically does not have the same amount of diarrhea the patient is having.  No significant right lower quadrant abdominal tenderness palpation noted suggest appendicitis or other acute surgical pathology causing his symptoms.  Given the persistence of his symptoms and the fact that this is a second visit stool sample was sent off prior to patient's discharge.  While in the emergency department patient was feeling much better was able to tolerate p.o.  Was given a prescription of Reglan to take as well as his Zofran as needed for any nausea or vomiting.  Blood sugar was elevated over 200 but patient was not in DKA.  Did notify the patient of this and will have him follow-up with his primary doctor.  After discharge did result as C. difficile antigen positive with reflex to PCR is showing toxigenic C. difficile and was recommended to treat only if he had symptomatic illness.  With his diarrhea I do feel that he would benefit from treatment so he was called and antibiotics were sent to his pharmacy.   This patient presents to the ED for concern of complaints listed in HPI, this involves an extensive number of treatment options, and is a complaint that carries with it a high risk of complications and morbidity. Disposition including potential need for admission considered.   Dispo: DC Home. Return precautions discussed including, but not limited to, those listed  in the AVS. Allowed pt time to ask questions which were answered fully prior to dc.  Additional history obtained from family Records reviewed Outpatient Clinic Notes The following labs were independently interpreted: Chemistry and show  hyperglycemia I personally reviewed and interpreted cardiac monitoring: normal sinus rhythm  I personally reviewed and interpreted the pt's EKG: see above for interpretation  I have reviewed the patients home medications and made adjustments as needed Social Determinants of health:  Frequent marijuana use         Final Clinical Impression(s) / ED Diagnoses Final diagnoses:  Hyperglycemia  Nausea and vomiting, unspecified vomiting type  Diarrhea of presumed infectious origin  Dehydration    Rx / DC Orders ED Discharge Orders          Ordered    metoCLOPramide (REGLAN) 10 MG tablet  Every 6 hours        01/09/23 1405              Rondel Baton, MD 01/10/23 2000

## 2023-01-09 NOTE — ED Triage Notes (Signed)
Patient has been vomiting since Friday. Went to the doctor on Tuesday and was told it is a bug. Continuing to vomit. Epigastric abdominal pain along with diarrhea.

## 2023-01-10 ENCOUNTER — Telehealth (HOSPITAL_COMMUNITY): Payer: Self-pay | Admitting: Emergency Medicine

## 2023-01-10 DIAGNOSIS — A0472 Enterocolitis due to Clostridium difficile, not specified as recurrent: Secondary | ICD-10-CM

## 2023-01-10 LAB — GASTROINTESTINAL PANEL BY PCR, STOOL (REPLACES STOOL CULTURE)

## 2023-01-10 LAB — CLOSTRIDIUM DIFFICILE BY PCR, REFLEXED: Toxigenic C. Difficile by PCR: POSITIVE — AB

## 2023-01-10 MED ORDER — FIDAXOMICIN 200 MG PO TABS
200.0000 mg | ORAL_TABLET | Freq: Two times a day (BID) | ORAL | 0 refills | Status: AC
  Filled 2023-01-10: qty 20, 10d supply, fill #0

## 2023-01-10 NOTE — Telephone Encounter (Addendum)
Attempted to reach patient but was unable to regarding his positive C. difficile results.  Initial results were equivocal but they are recommending treatment if he is symptomatic which it appears that he is with his frequent diarrhea.  Did leave voicemail to let the patient know that he will likely need antibiotics which are being [facility] pharmacy which is on file for him.  Will try to contact him again shortly.  Was able to reach the patient.  He is still having diarrhea but is otherwise doing well.  Nausea and vomiting have improved though he did have 1 episode of emesis earlier today.  He will go pick up his fidaxomicin  when the pharmacy opens tomorrow.

## 2023-01-13 ENCOUNTER — Other Ambulatory Visit: Payer: Self-pay

## 2023-01-13 ENCOUNTER — Other Ambulatory Visit (HOSPITAL_BASED_OUTPATIENT_CLINIC_OR_DEPARTMENT_OTHER): Payer: Self-pay

## 2023-01-14 ENCOUNTER — Other Ambulatory Visit (HOSPITAL_BASED_OUTPATIENT_CLINIC_OR_DEPARTMENT_OTHER): Payer: Self-pay

## 2023-01-16 ENCOUNTER — Other Ambulatory Visit (HOSPITAL_BASED_OUTPATIENT_CLINIC_OR_DEPARTMENT_OTHER): Payer: Self-pay

## 2023-01-17 ENCOUNTER — Other Ambulatory Visit (HOSPITAL_BASED_OUTPATIENT_CLINIC_OR_DEPARTMENT_OTHER): Payer: Self-pay

## 2023-01-20 ENCOUNTER — Other Ambulatory Visit (HOSPITAL_BASED_OUTPATIENT_CLINIC_OR_DEPARTMENT_OTHER): Payer: Self-pay

## 2023-01-21 ENCOUNTER — Other Ambulatory Visit (HOSPITAL_BASED_OUTPATIENT_CLINIC_OR_DEPARTMENT_OTHER): Payer: Self-pay

## 2023-01-23 ENCOUNTER — Other Ambulatory Visit (HOSPITAL_BASED_OUTPATIENT_CLINIC_OR_DEPARTMENT_OTHER): Payer: Self-pay

## 2023-01-23 ENCOUNTER — Ambulatory Visit: Payer: BLUE CROSS/BLUE SHIELD | Admitting: Medical

## 2023-01-23 ENCOUNTER — Encounter: Payer: Self-pay | Admitting: Medical

## 2023-01-23 VITALS — BP 146/85 | HR 77 | Resp 18 | Ht 66.0 in | Wt 313.0 lb

## 2023-01-23 DIAGNOSIS — R739 Hyperglycemia, unspecified: Secondary | ICD-10-CM

## 2023-01-23 DIAGNOSIS — K760 Fatty (change of) liver, not elsewhere classified: Secondary | ICD-10-CM

## 2023-01-23 DIAGNOSIS — Z6841 Body Mass Index (BMI) 40.0 and over, adult: Secondary | ICD-10-CM

## 2023-01-23 DIAGNOSIS — I10 Essential (primary) hypertension: Secondary | ICD-10-CM

## 2023-01-23 DIAGNOSIS — F17200 Nicotine dependence, unspecified, uncomplicated: Secondary | ICD-10-CM

## 2023-01-23 MED ORDER — LOSARTAN POTASSIUM 25 MG PO TABS
25.0000 mg | ORAL_TABLET | Freq: Every day | ORAL | 3 refills | Status: DC
  Filled 2023-01-23: qty 90, 90d supply, fill #0

## 2023-01-23 NOTE — Patient Instructions (Signed)
Hypertension: Blood pressure elevated at 146/85. Strong family history of hypertension. No prior antihypertensive therapy. -Start Losartan 25mg  daily. -Recheck blood pressure in 1 month.  Diabetes: History of elevated blood sugars in the 200s. No prior antidiabetic therapy. -Order A1c and metabolic panel today. -If confirmed, initiate antidiabetic medication.  Obesity: Sedentary lifestyle and unhealthy diet. -Encourage 6,000-10,000 steps per day. -Recommend Weight Watchers app for dietary guidance. -Reevaluate in 1 month.  Tobacco Use: Pack a day smoker for 15 years. -Recommend smoking cessation. -Consider nicotine patches or other cessation aids in the future.  Alcohol Use: Consumes approximately 6 drinks per week. -Advise to reduce alcohol consumption.  Fatty Liver: Identified on prior imaging. -Encourage weight loss and reduction in alcohol and fatty food consumption. -Periodically monitor liver enzymes and consider repeat ultrasound in the future.  Resolved C. Diff Infection: Diarrhea resolved without antibiotic treatment. -Advise patient to monitor for recurrence of symptoms. -If symptoms recur, order stool culture panel, ova/parasite test, and C. diff test.  Hyperlipidemia: No prior lipid panel. -Order lipid panel today.   follow up in one month or sooner if needed.

## 2023-01-23 NOTE — Progress Notes (Signed)
   Subjective:    Patient ID: Dylan Morris, male    DOB: September 14, 1990, 32 y.o.   MRN: [id number]  HPI Discussed the use of AI scribe software for clinical note transcription with the patient, who gave verbal consent to proceed.  History of Present Illness   The patient, with a history of hypertension and self dx diabetes, presents for his first visit. He reports that he has never been on medication for either condition. He has been told in the past that his blood pressure is borderline high and he has a strong family history of hypertension. Recently, during an episode of C. Diff infection, his blood sugar levels were noted to be in the 200s.  The patient has a sedentary lifestyle, with an estimated daily step count of a couple thousand. He has recently started to make dietary changes, reducing red meat intake. He has a significant smoking history, consuming a pack of cigarettes daily for approximately 15 years, in addition to marijuana use. He also reports moderate alcohol consumption, averaging a six-pack per week.  The patient recently experienced a C. Diff infection, presenting with diarrhea. However, he reports that the diarrhea resolved without taking the prescribed antibiotic. He has been asymptomatic since, with formed stools and no associated symptoms such as nausea or vomiting. He has returned to work and reports feeling well.         Review of Systems See hpi.    Objective:   Physical Exam  General Mental Status- Alert. General Appearance- Not in acute distress.   Skin General: Color- Normal Color. Moisture- Normal Moisture.  Neck Carotid Arteries- Normal color. Moisture- Normal Moisture. No carotid bruits. No JVD.  Chest and Lung Exam Auscultation: Breath Sounds:-Normal.  Cardiovascular Auscultation:Rythm- Regular. Murmurs & Other Heart Sounds:Auscultation of the heart reveals- No Murmurs.  Abdomen Inspection:-Inspeection Normal. Palpation/Percussion:Note:No  mass. Palpation and Percussion of the abdomen reveal- Non Tender, Non Distended + BS, no rebound or guarding.    Neurologic Cranial Nerve exam:- CN III-XII intact(No nystagmus), symmetric smile. Strength:- 5/5 equal and symmetric strength both upper and lower extremities.       Assessment & Plan:  Assessment and Plan    Hypertension: Blood pressure elevated at 146/85. Strong family history of hypertension. No prior antihypertensive therapy. -Start Losartan 25mg  daily. -Recheck blood pressure in 1 month.  Diabetes: History of elevated blood sugars in the 200s. No prior antidiabetic therapy. -Order A1c and metabolic panel today. -If confirmed, initiate antidiabetic medication.  Obesity: Sedentary lifestyle and unhealthy diet. -Encourage 6,000-10,000 steps per day. -Recommend Weight Watchers app for dietary guidance. -Reevaluate in 1 month.  Tobacco Use: Pack a day smoker for 15 years. -Recommend smoking cessation. -Consider nicotine patches or other cessation aids in the future.  Alcohol Use: Consumes approximately 6 drinks per week. -Advise to reduce alcohol consumption.  Fatty Liver: Identified on prior imaging. -Encourage weight loss and reduction in alcohol and fatty food consumption. -Periodically monitor liver enzymes and consider repeat ultrasound in the future.  Resolved C. Diff Infection: Diarrhea resolved without antibiotic treatment. -Advise patient to monitor for recurrence of symptoms. -If symptoms recur, order stool culture panel, ova/parasite test, and C. diff test.  Hyperlipidemia: No prior lipid panel. -Order lipid panel today.   follow up in one month or sooner if needed.       Esperanza Richters, PA-C

## 2023-01-28 ENCOUNTER — Other Ambulatory Visit (HOSPITAL_BASED_OUTPATIENT_CLINIC_OR_DEPARTMENT_OTHER): Payer: Self-pay

## 2023-01-31 ENCOUNTER — Other Ambulatory Visit (HOSPITAL_BASED_OUTPATIENT_CLINIC_OR_DEPARTMENT_OTHER): Payer: Self-pay

## 2023-02-03 ENCOUNTER — Other Ambulatory Visit (HOSPITAL_BASED_OUTPATIENT_CLINIC_OR_DEPARTMENT_OTHER): Payer: Self-pay

## 2023-02-04 ENCOUNTER — Other Ambulatory Visit (HOSPITAL_BASED_OUTPATIENT_CLINIC_OR_DEPARTMENT_OTHER): Payer: Self-pay

## 2023-02-05 ENCOUNTER — Encounter (INDEPENDENT_AMBULATORY_CARE_PROVIDER_SITE_OTHER): Payer: Self-pay

## 2023-02-06 ENCOUNTER — Other Ambulatory Visit (HOSPITAL_BASED_OUTPATIENT_CLINIC_OR_DEPARTMENT_OTHER): Payer: Self-pay

## 2023-02-07 ENCOUNTER — Other Ambulatory Visit: Payer: Self-pay

## 2023-02-27 ENCOUNTER — Other Ambulatory Visit (HOSPITAL_BASED_OUTPATIENT_CLINIC_OR_DEPARTMENT_OTHER): Payer: Self-pay

## 2023-02-27 ENCOUNTER — Ambulatory Visit (INDEPENDENT_AMBULATORY_CARE_PROVIDER_SITE_OTHER): Payer: BLUE CROSS/BLUE SHIELD | Admitting: Medical

## 2023-02-27 ENCOUNTER — Encounter: Payer: Self-pay | Admitting: Medical

## 2023-02-27 VITALS — BP 140/83 | HR 97 | Temp 98.0°F | Resp 18 | Ht 66.0 in | Wt 315.0 lb

## 2023-02-27 DIAGNOSIS — E119 Type 2 diabetes mellitus without complications: Secondary | ICD-10-CM | POA: Diagnosis not present

## 2023-02-27 DIAGNOSIS — K760 Fatty (change of) liver, not elsewhere classified: Secondary | ICD-10-CM | POA: Diagnosis not present

## 2023-02-27 DIAGNOSIS — I10 Essential (primary) hypertension: Secondary | ICD-10-CM

## 2023-02-27 DIAGNOSIS — R739 Hyperglycemia, unspecified: Secondary | ICD-10-CM | POA: Diagnosis not present

## 2023-02-27 LAB — LIPID PANEL
Cholesterol: 213 mg/dL — ABNORMAL HIGH (ref 0–200)
HDL: 34.1 mg/dL — ABNORMAL LOW (ref >39.00)
NonHDL: 178.96
Total CHOL/HDL Ratio: 6
Triglycerides: 215 mg/dL — ABNORMAL HIGH (ref 0.0–149.0)
VLDL: 43 mg/dL — ABNORMAL HIGH (ref 0.0–40.0)

## 2023-02-27 LAB — LDL CHOLESTEROL, DIRECT: Direct LDL: 145 mg/dL

## 2023-02-27 LAB — COMPREHENSIVE METABOLIC PANEL
ALT: 19 U/L (ref 0–53)
AST: 16 U/L (ref 0–37)
Albumin: 3.9 g/dL (ref 3.5–5.2)
Alkaline Phosphatase: 80 U/L (ref 39–117)
BUN: 10 mg/dL (ref 6–23)
CO2: 31 mEq/L (ref 19–32)
Calcium: 9.3 mg/dL (ref 8.4–10.5)
Chloride: 101 mEq/L (ref 96–112)
Creatinine, Ser: 0.79 mg/dL (ref 0.40–1.50)
GFR: 116.46 mL/min (ref >60.00)
Glucose, Bld: 189 mg/dL — ABNORMAL HIGH (ref 70–99)
Potassium: 3.9 mEq/L (ref 3.5–5.1)
Sodium: 139 mEq/L (ref 135–145)
Total Bilirubin: 0.4 mg/dL (ref 0.2–1.2)
Total Protein: 6.9 g/dL (ref 6.0–8.3)

## 2023-02-27 LAB — HEMOGLOBIN A1C: Hgb A1c MFr Bld: 8.8 % — ABNORMAL HIGH (ref 4.6–6.5)

## 2023-02-27 MED ORDER — LOSARTAN POTASSIUM 50 MG PO TABS
50.0000 mg | ORAL_TABLET | Freq: Every day | ORAL | 3 refills | Status: AC
  Filled 2023-02-27: qty 90, 90d supply, fill #0

## 2023-02-27 NOTE — Progress Notes (Signed)
Subjective:    Patient ID: Dylan Morris, male    DOB: [birth year], 32 y.o.   MRN: [id number]  HPI  Discussed the use of AI scribe software for clinical note transcription with the patient, who gave verbal consent to proceed.  History of Present Illness   The patient, with a history of hypertension and elevated blood sugars indicative of likely diabetes, presents for a follow-up visit. They report no changes in their alcohol consumption, which remains sporadic and infrequent, averaging six drinks per week. The patient acknowledges the potential impact of alcohol on blood sugar levels.  Regarding hypertension, the patient does not have a home blood pressure monitor but has access to one through a family member who is a Theatre stage manager. The patient has been taking Losartan 25 mg daily as prescribed.  The patient's blood sugars have been consistently over 200, raising concerns about uncontrolled diabetes. The patient has been informed about the implications of an average blood sugar level over 140.  The patient's blood pressure readings have remained high, prompting an increase in the Losartan dosage from 25 mg to 50 mg daily. The patient has been advised to monitor their blood pressure at least three times a week, with the goal of achieving readings closer to 130/80.  The patient's elevated blood sugars have led to a high suspicion of diabetes. The patient is scheduled for an A1c and metabolic panel to confirm the diagnosis and guide treatment decisions. A lipid panel is also planned due to the patient's hypertension and suspected diabetes.         Review of Systems  Constitutional:  Negative for chills, fatigue and fever.  Respiratory:  Negative for cough, chest tightness and wheezing.   Cardiovascular:  Negative for chest pain and palpitations.  Gastrointestinal:  Negative for abdominal pain, constipation, diarrhea and vomiting.  Musculoskeletal:  Negative for back pain and myalgias.   Skin:  Negative for rash.  Neurological:  Negative for dizziness, syncope, weakness and numbness.  Hematological:  Negative for adenopathy. Does not bruise/bleed easily.    Past Medical History:  Diagnosis Date   Diabetes mellitus without complication (HCC)    Hypertension      Social History   Socioeconomic History   Marital status: Single    Spouse name: Not on file   Number of children: Not on file   Years of education: Not on file   Highest education level: Not on file  Occupational History   Occupation: [employer]  Tobacco Use   Smoking status: Every Day    Types: Cigarettes    Start date: 2008   Smokeless tobacco: Never  Vaping Use   Vaping status: Never Used  Substance and Sexual Activity   Alcohol use: Yes    Comment: social. 6 pack a week on average spread out.   Drug use: Yes    Types: Marijuana    Comment: 3-4 joints a day.   Sexual activity: Not on file  Other Topics Concern   Not on file  Social History Narrative   Not on file   Social Determinants of Health   Financial Resource Strain: Not on file  Food Insecurity: Not on file  Transportation Needs: Not on file  Physical Activity: Not on file  Stress: Not on file  Social Connections: Not on file  Intimate Partner Violence: Not on file    No past surgical history on file.  No family history on file.  No Known Allergies  Current Outpatient Medications  on File Prior to Visit  Medication Sig Dispense Refill   fidaxomicin (DIFICID) 200 MG TABS tablet Take 1 tablet (200 mg total) by mouth 2 (two) times daily. 20 tablet 0   losartan (COZAAR) 25 MG tablet Take 1 tablet (25 mg total) by mouth daily. 90 tablet 3   metoCLOPramide (REGLAN) 10 MG tablet Take 1 tablet (10 mg total) by mouth every 6 (six) hours. 20 tablet 0   No current facility-administered medications on file prior to visit.    BP (!) 140/83   Pulse 97   Temp 98 F (36.7 C)   Resp 18   Ht 5\' 6"  (1.676 m)   Wt (!) 315 lb (142.9  kg)   SpO2 100%   BMI 50.84 kg/m        Objective:   Physical Exam  General Mental Status- Alert. General Appearance- Not in acute distress.    Skin General: Color- Normal Color. Moisture- Normal Moisture.   Neck Carotid Arteries- Normal color. Moisture- Normal Moisture. No carotid bruits. No JVD.   Chest and Lung Exam Auscultation: Breath Sounds:-Normal.   Cardiovascular Auscultation:Rythm- Regular. Murmurs & Other Heart Sounds:Auscultation of the heart reveals- No Murmurs.   Abdomen Inspection:-Inspeection Normal. Palpation/Percussion:Note:No mass. Palpation and Percussion of the abdomen reveal- Non Tender, Non Distended + BS, no rebound or guarding.       Neurologic Cranial Nerve exam:- CN III-XII intact(No nystagmus), symmetric smile. Strength:- 5/5 equal and symmetric strength both upper and lower extremities.       Assessment & Plan:  Assessment and Plan    Hypertension Blood pressure remains elevated despite Losartan 25mg  daily. Goal is to achieve BP around 130/80. -Increase Losartan to 50mg  daily. -Advise patient to monitor blood pressure at least three times a week. -Check blood pressure readings in two weeks via MyChart.  Suspected Diabetes Elevated blood sugars over 200. Alcohol use could be contributing to elevated sugars. -Order A1c and metabolic panel today. -Advise patient to minimize alcohol consumption. -Plan for medication initiation based on lab results.  Hyperlipidemia Risk factor for cardiovascular disease given hypertension and suspected diabetes. -Order lipid panel today. -Follow up based on lab results and bp review once you send those by my chart  - update me on bp readings in 2 weeks. Sooner if needed.       Esperanza Richters, PA-C

## 2023-02-27 NOTE — Patient Instructions (Addendum)
Hypertension Blood pressure remains elevated despite Losartan 25mg  daily. Goal is to achieve BP around 130/80. -Increase Losartan to 50mg  daily. -Advise patient to monitor blood pressure at least three times a week. -Check blood pressure readings in two weeks via MyChart.  Suspected Diabetes Elevated blood sugars over 200. Alcohol use could be contributing to elevated sugars. -Order A1c and metabolic panel today. -Advise patient to minimize alcohol consumption. -Plan for medication initiation based on lab results.  Hyperlipidemia Risk factor for cardiovascular disease given hypertension and suspected diabetes. -Order lipid panel today. -Follow up based on lab results and bp review once you send those by my chart    - update me on bp readings in 2 weeks. Sooner if needed. Will advise in office visit after review both labs and bp levels.

## 2023-03-01 MED ORDER — METFORMIN HCL 500 MG PO TABS
500.0000 mg | ORAL_TABLET | Freq: Two times a day (BID) | ORAL | 3 refills | Status: AC
  Filled 2023-03-01: qty 180, 90d supply, fill #0

## 2023-03-01 NOTE — Addendum Note (Signed)
Addended by: Gwenevere Abbot on: 03/01/2023 07:43 AM   Modules accepted: Orders

## 2023-03-03 ENCOUNTER — Other Ambulatory Visit (HOSPITAL_BASED_OUTPATIENT_CLINIC_OR_DEPARTMENT_OTHER): Payer: Self-pay

## 2023-03-13 ENCOUNTER — Other Ambulatory Visit (HOSPITAL_BASED_OUTPATIENT_CLINIC_OR_DEPARTMENT_OTHER): Payer: Self-pay
